# Patient Record
Sex: Male | Born: 1990 | Race: Black or African American | Hispanic: No | Marital: Married | Smoking: Current some day smoker
Health system: Southern US, Community
[De-identification: ages and names within clinical notes are randomized; demographics above are authoritative.]

---

## 2015-06-18 ENCOUNTER — Encounter (HOSPITAL_BASED_OUTPATIENT_CLINIC_OR_DEPARTMENT_OTHER): Payer: Self-pay | Admitting: *Deleted

## 2015-06-18 ENCOUNTER — Emergency Department (HOSPITAL_BASED_OUTPATIENT_CLINIC_OR_DEPARTMENT_OTHER): Payer: Self-pay

## 2015-06-18 ENCOUNTER — Emergency Department (HOSPITAL_BASED_OUTPATIENT_CLINIC_OR_DEPARTMENT_OTHER)
Admission: EM | Admit: 2015-06-18 | Discharge: 2015-06-18 | Disposition: A | Discharge status: Home / Self Care | Payer: Self-pay | Attending: Emergency Medicine | Admitting: Emergency Medicine

## 2015-06-18 DIAGNOSIS — J069 Acute upper respiratory infection, unspecified: Secondary | ICD-10-CM | POA: Insufficient documentation

## 2015-06-18 DIAGNOSIS — F17200 Nicotine dependence, unspecified, uncomplicated: Secondary | ICD-10-CM | POA: Insufficient documentation

## 2015-06-18 DIAGNOSIS — J40 Bronchitis, not specified as acute or chronic: Secondary | ICD-10-CM | POA: Insufficient documentation

## 2015-06-18 MED ORDER — DM-GUAIFENESIN ER 30-600 MG PO TB12: 1.0000 | ORAL_TABLET | Freq: Two times a day (BID) | ORAL | Status: AC

## 2015-06-18 NOTE — ED Notes (Signed)
States has had a low grade fever since Monday. Has taken Alka-Seltzer Cold and Flu liquid gels

## 2015-06-18 NOTE — ED Provider Notes (Signed)
CSN: [id number]     Arrival date & time 06/18/15  [id number] History   First MD Initiated Contact with Patient 06/18/15 1120     Chief Complaint  Patient presents with  . Fever     (Consider location/radiation/quality/duration/timing/severity/associated sxs/prior Treatment) Patient is a 24 y.o. male presenting with fever. The history is provided by the patient.  Fever Associated symptoms: cough and myalgias   Associated symptoms: no chest pain, no confusion, no congestion, no diarrhea, no dysuria, no headaches, no nausea, no rash and no vomiting    patient with one-week history of fever congestion cough bodyaches. No nausea vomiting or diarrhea no abdominal pain. No severe headache.  History reviewed. No pertinent past medical history. History reviewed. No pertinent past surgical history. No family history on file. Social History  Substance Use Topics  . Smoking status: Current Every Day Smoker  . Smokeless tobacco: None  . Alcohol Use: Yes    Review of Systems  Constitutional: Positive for fever.  HENT: Negative for congestion.   Eyes: Negative for redness.  Respiratory: Positive for cough and shortness of breath.   Cardiovascular: Negative for chest pain.  Gastrointestinal: Negative for nausea, vomiting, abdominal pain and diarrhea.  Genitourinary: Negative for dysuria.  Musculoskeletal: Positive for myalgias.  Skin: Negative for rash.  Neurological: Negative for headaches.  Hematological: Does not bruise/bleed easily.  Psychiatric/Behavioral: Negative for confusion.      Allergies  Review of patient's allergies indicates no known allergies.  Home Medications   Prior to Admission medications   Medication Sig Start Date End Date Taking? Authorizing Provider  dextromethorphan-guaiFENesin (MUCINEX DM) 30-600 MG 12hr tablet Take 1 tablet by mouth 2 (two) times daily. 06/18/15   Vanetta MuldersScott Zackarie Chason, MD   BP 104/64 mmHg  Pulse 80  Temp(Src) 98.5 F (36.9 C) (Oral)  Resp 18   Ht 5\' 6"  (1.676 m)  Wt 102.059 kg  BMI 36.33 kg/m2  SpO2 98% Physical Exam  Constitutional: He is oriented to person, place, and time. He appears well-developed and well-nourished. No distress.  HENT:  Head: Normocephalic and atraumatic.  Mouth/Throat: Oropharynx is clear and moist.  Eyes: Conjunctivae and EOM are normal. Pupils are equal, round, and reactive to light.  Neck: Normal range of motion. Neck supple.  Cardiovascular: Normal rate, regular rhythm and normal heart sounds.   No murmur heard. Pulmonary/Chest: Effort normal and breath sounds normal. No respiratory distress. He has no wheezes. He has no rales.  Abdominal: Soft. Bowel sounds are normal. There is no tenderness.  Musculoskeletal: Normal range of motion. He exhibits no edema.  Neurological: He is alert and oriented to person, place, and time. No cranial nerve deficit. He exhibits normal muscle tone. Coordination normal.  Skin: Skin is warm. No rash noted.  Nursing note and vitals reviewed.   ED Course  Procedures (including critical care time) Labs Review Labs Reviewed - No data to display  Imaging Review Dg Chest 2 View  06/18/2015  CLINICAL DATA:  Productive cough and low-grade fever. History of asthma. Current smoker. EXAM: CHEST  2 VIEW COMPARISON:  None. FINDINGS: Normal mediastinum and cardiac silhouette. Normal pulmonary vasculature. No evidence of effusion, infiltrate, or pneumothorax. No acute bony abnormality. IMPRESSION: No acute cardiopulmonary process. Electronically Signed   By: Genevive BiStewart  Edmunds M.D.   On: 06/18/2015 13:08   I have personally reviewed and evaluated these images and lab results as part of my medical decision-making.   EKG Interpretation None      MDM  Final diagnoses:  URI (upper respiratory infection)  Bronchitis    Chest x-ray negative for pneumonia. Patient with upper respiratory cough congestion, non-productive cough since Monday. Patient nontoxic no acute distress.  Patient will be treated symptomatically with Mucinex DM. Symptoms consistent with upper respiratory infection most likely viral and/or bronchitis. Patient without any wheezing.    Vanetta Mulders, MD 06/18/15 1350

## 2015-06-18 NOTE — ED Notes (Signed)
DC instructions reviewed with pt, discussed importance of hand washing, increasing PO fluids and using medication as prescribed by EDP. Opportunity for questions provided. Teach Back Method used

## 2015-06-18 NOTE — Discharge Instructions (Signed)
Chest x-ray negative for pneumonia. Use the Mucinex DM as directed will help suppress the cough and keep the phlegm loose. Can take Motrin as needed in addition for any body aches. Return for any new or worse symptoms. Work note provided.

## 2016-04-09 ENCOUNTER — Emergency Department (HOSPITAL_BASED_OUTPATIENT_CLINIC_OR_DEPARTMENT_OTHER)
Admission: EM | Admit: 2016-04-09 | Discharge: 2016-04-09 | Disposition: A | Discharge status: Home / Self Care | Payer: Self-pay | Attending: Emergency Medicine | Admitting: Emergency Medicine

## 2016-04-09 ENCOUNTER — Encounter (HOSPITAL_BASED_OUTPATIENT_CLINIC_OR_DEPARTMENT_OTHER): Payer: Self-pay

## 2016-04-09 DIAGNOSIS — M542 Cervicalgia: Secondary | ICD-10-CM | POA: Insufficient documentation

## 2016-04-09 DIAGNOSIS — F17200 Nicotine dependence, unspecified, uncomplicated: Secondary | ICD-10-CM | POA: Insufficient documentation

## 2016-04-09 MED ORDER — METHOCARBAMOL 500 MG PO TABS: 500.0000 mg | ORAL_TABLET | Freq: Two times a day (BID) | ORAL | 0 refills | Status: DC

## 2016-04-09 MED ORDER — METHOCARBAMOL 500 MG PO TABS
500.0000 mg | ORAL_TABLET | Freq: Once | ORAL | Status: AC
  Administered 2016-04-09: 500 mg via ORAL
  Filled 2016-04-09: qty 1

## 2016-04-09 MED ORDER — NAPROXEN 500 MG PO TABS: 500.0000 mg | ORAL_TABLET | Freq: Two times a day (BID) | ORAL | 0 refills | Status: DC

## 2016-04-09 MED ORDER — NAPROXEN 250 MG PO TABS
500.0000 mg | ORAL_TABLET | Freq: Once | ORAL | Status: AC
  Administered 2016-04-09: 500 mg via ORAL
  Filled 2016-04-09: qty 2

## 2016-04-09 NOTE — ED Notes (Signed)
Pt had CT of head and neck at [facility] earlier this evening

## 2016-04-09 NOTE — Discharge Instructions (Signed)
Your head and neck CT from [facility] earlier today was normal. Take the prescribed medication as directed. May wish to use heat to help relax muscles in your neck. Follow-up with  Return to the ED for new or worsening symptoms.

## 2016-04-09 NOTE — ED Provider Notes (Signed)
[facility]   CSN: [id number] Arrival date & time: 04/09/16  2004  By signing my name below, I, Linna DarnerRussell Turner, attest that this documentation has been prepared under the direction and in the presence of Sharilyn SitesLisa Nolawi Kanady, PA-C. Electronically Signed: Linna Darnerussell Turner, Scribe. 04/09/2016. 9:22 PM.  History   Chief Complaint Chief Complaint  Patient presents with  . Neck Pain    The history is provided by the patient. No language interpreter was used.     HPI Comments: Raymond Collins is a 25 y.o. male who presents to the Emergency Department complaining of sudden onset, constant, neck pain beginning two days ago. Pt reports he bumped the top of his head while getting into an SUV and his neck pain presented after; he notes he did not lose consciousness. He states his pain is most significant on the right side of his neck. Pt notes associated neck stiffness. He reports pain radiation down his spine with twisting his neck. He notes he sleeps on his right side. Pt went to [facility] earlier today and had a CT of his head/neck taken that was negative. He has tried tylenol and ibuprofen with no relief of his symptoms. He denies head pain, numbness, weakness, or any other associated symptoms.  History reviewed. No pertinent past medical history.  There are no active problems to display for this patient.   History reviewed. No pertinent surgical history.     Home Medications    Prior to Admission medications   Medication Sig Start Date End Date Taking? Authorizing Provider  dextromethorphan-guaiFENesin (MUCINEX DM) 30-600 MG 12hr tablet Take 1 tablet by mouth 2 (two) times daily. 06/18/15   Vanetta MuldersScott Zackowski, MD    Family History No family history on file.  Social History Social History  Substance Use Topics  . Smoking status: Current Every Day Smoker  . Smokeless tobacco: Not on file  . Alcohol use Yes     Allergies   Review of patient's allergies  indicates no known allergies.   Review of Systems Review of Systems  Musculoskeletal: Positive for neck pain and neck stiffness.  Neurological: Negative for syncope, weakness and numbness.  All other systems reviewed and are negative.   Physical Exam Updated Vital Signs BP 113/71 (BP Location: Left Arm)   Pulse 86   Temp 98.1 F (36.7 C) (Oral)   Resp 18   Ht 5\' 6"  (1.676 m)   Wt 225 lb (102.1 kg)   SpO2 100%   BMI 36.32 kg/m   Physical Exam  Constitutional: He is oriented to person, place, and time. He appears well-developed and well-nourished. No distress.  HENT:  Head: Normocephalic and atraumatic.  Right Ear: External ear normal.  Left Ear: External ear normal.  Mouth/Throat: Oropharynx is clear and moist.  Eyes: Conjunctivae and EOM are normal. Pupils are equal, round, and reactive to light.  Neck: Normal range of motion and full passive range of motion without pain. Neck supple. No neck rigidity.  No rigidity, no meningismus  Cardiovascular: Normal rate, regular rhythm and normal heart sounds.   No murmur heard. Pulmonary/Chest: Effort normal and breath sounds normal. No respiratory distress. He has no wheezes. He has no rhonchi.  Abdominal: Soft. Bowel sounds are normal. There is no tenderness. There is no guarding.  Musculoskeletal: Normal range of motion. He exhibits no edema.       Cervical back: He exhibits pain and spasm.  Tenderness and spasm of the cervical paraspinal muscles bilaterally,  full range of motion maintained, no rigidity, no midline tenderness or step-off  Neurological: He is alert and oriented to person, place, and time. He has normal strength. He displays no tremor. No cranial nerve deficit or sensory deficit. He displays no seizure activity.  AAOx3, answering questions and following commands appropriately; equal strength UE and LE bilaterally; CN grossly intact; moves all extremities appropriately without ataxia; no focal neuro deficits or facial  asymmetry appreciated  Skin: Skin is warm and dry. No rash noted. He is not diaphoretic.  Psychiatric: He has a normal mood and affect. His behavior is normal. Thought content normal.  Nursing note and vitals reviewed.   ED Treatments / Results  Labs (all labs ordered are listed, but only abnormal results are displayed) Labs Reviewed - No data to display  EKG  EKG Interpretation None       Radiology No results found.  Procedures Procedures (including critical care time)  DIAGNOSTIC STUDIES: Oxygen Saturation is 100% on RA, normal by my interpretation.    COORDINATION OF CARE: 9:27 PM Discussed treatment plan with pt at bedside and pt agreed to plan.  Medications Ordered in ED Medications - No data to display   Initial Impression / Assessment and Plan / ED Course  I have reviewed the triage vital signs and the nursing notes.  Pertinent labs & imaging results that were available during my care of the patient were reviewed by me and considered in my medical decision making (see chart for details).  Clinical Course   25 year old male here with neck pain. He struck the top of his head will getting into an SUV 2 days ago. He was at [facility] earlier today and had a head and neck CT which I have reviewed-- no acute findings, specifically no intracranial hemorrhage or cervical spine fracture. On exam here patient with tenderness and spasm of the cervical paraspinal muscles. This is likely the source of his pain. He has no neurologic deficits to suggest central cord syndrome. Will treat symptomatically. Recommended PCP follow-up.  Discussed plan with patient, he acknowledged understanding and agreed with plan of care.  Return precautions given for new or worsening symptoms.  I personally performed the services described in this documentation, which was scribed in my presence. The recorded information has been reviewed and is accurate.  Final Clinical Impressions(s) / ED  Diagnoses   Final diagnoses:  Neck pain    New Prescriptions Discharge Medication List as of 04/09/2016  9:30 PM    START taking these medications   Details  methocarbamol (ROBAXIN) 500 MG tablet Take 1 tablet (500 mg total) by mouth 2 (two) times daily., Starting Mon 04/09/2016, Print    naproxen (NAPROSYN) 500 MG tablet Take 1 tablet (500 mg total) by mouth 2 (two) times daily with a meal., Starting Mon 04/09/2016, Print         Garlon Hatchet, PA-C 04/09/16 2210    Lavera Guise, MD 04/10/16 2100

## 2016-04-09 NOTE — ED Notes (Signed)
PA at bedside.

## 2016-04-09 NOTE — ED Triage Notes (Signed)
Pt c/o neck and head pain after hitting his head getting into the back of an SUV on Saturday, no LOC, pt tried tylenol last night and it did not help.  Pt seems able to move his head around without difficulty.

## 2016-05-24 ENCOUNTER — Encounter (HOSPITAL_BASED_OUTPATIENT_CLINIC_OR_DEPARTMENT_OTHER): Payer: Self-pay | Admitting: *Deleted

## 2016-05-24 ENCOUNTER — Emergency Department (HOSPITAL_BASED_OUTPATIENT_CLINIC_OR_DEPARTMENT_OTHER)
Admission: EM | Admit: 2016-05-24 | Discharge: 2016-05-24 | Disposition: A | Discharge status: Home / Self Care | Payer: Self-pay | Attending: Physician Assistant | Admitting: Physician Assistant

## 2016-05-24 DIAGNOSIS — F17200 Nicotine dependence, unspecified, uncomplicated: Secondary | ICD-10-CM | POA: Insufficient documentation

## 2016-05-24 DIAGNOSIS — J069 Acute upper respiratory infection, unspecified: Secondary | ICD-10-CM | POA: Insufficient documentation

## 2016-05-24 MED ORDER — PSEUDOEPHEDRINE HCL 30 MG PO TABS: 30.0000 mg | ORAL_TABLET | Freq: Four times a day (QID) | ORAL | 0 refills | Status: AC | PRN

## 2016-05-24 MED ORDER — GUAIFENESIN 100 MG/5ML PO LIQD: 100.0000 mg | ORAL | 0 refills | Status: AC | PRN

## 2016-05-24 MED ORDER — SALINE SPRAY 0.65 % NA SOLN: 1.0000 | NASAL | 0 refills | Status: AC | PRN

## 2016-05-24 MED FILL — ROBAFEN 100 MG/5 ML SYRUP: 100 | 2 days supply | Qty: 118 | Fill #0

## 2016-05-24 MED FILL — DEEP SEA 0.65% NOSE SPRAY: 0.65 | 30 days supply | Qty: 44 | Fill #0

## 2016-05-24 MED FILL — SUDOGEST 30 MG TABLET: 30 | 8 days supply | Qty: 30 | Fill #0

## 2016-05-24 NOTE — ED Triage Notes (Signed)
Pt c/o URI symptoms x 2 days 

## 2016-05-24 NOTE — ED Provider Notes (Signed)
[facility]   CSN: [id number] Arrival date & time: 05/24/16  [id number]     History   Chief Complaint Chief Complaint  Patient presents with  . URI    HPI Raymond Collins is a 25 y.o. male.  Patient presents to the emergency department with chief complaint of cough, congestion, runny nose, sore throat. He states that the symptoms started yesterday. He also states when he sneezes, he sees some blood-tinged mucus. He has not tried taking anything for his symptoms. He denies any fevers chills. 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 There are no other associated symptoms. There are no other modifying factors.   The history is provided by the patient. No language interpreter was used.    History reviewed. No pertinent past medical history.  There are no active problems to display for this patient.   History reviewed. No pertinent surgical history.     Home Medications    Prior to Admission medications   Medication Sig Start Date End Date Taking? Authorizing Provider  ranitidine (ZANTAC) 150 MG tablet Take 150 mg by mouth 2 (two) times daily.   Yes Historical Provider, MD  dextromethorphan-guaiFENesin (MUCINEX DM) 30-600 MG 12hr tablet Take 1 tablet by mouth 2 (two) times daily. 06/18/15   Vanetta MuldersScott Zackowski, MD  naproxen (NAPROSYN) 500 MG tablet Take 1 tablet (500 mg total) by mouth 2 (two) times daily with a meal. 04/09/16   Garlon HatchetLisa M Sanders, PA-C    Family History History reviewed. No pertinent family history.  Social History Social History  Substance Use Topics  . Smoking status: Current Some Day Smoker  . Smokeless tobacco: Not on file  . Alcohol use Yes     Allergies   Patient has no known allergies.   Review of Systems Review of Systems  Constitutional: Negative for chills and fever.  HENT: Positive for postnasal drip, rhinorrhea, sinus pressure, sneezing and sore throat.     Respiratory: Positive for cough. Negative for shortness of breath.   Cardiovascular: Negative for chest pain.  Gastrointestinal: Negative for abdominal pain, constipation, diarrhea, nausea and vomiting.  Genitourinary: Negative for dysuria.     Physical Exam Updated Vital Signs BP 121/71 (BP Location: Right Arm)   Pulse 86   Temp 98.7 F (37.1 C)   Resp 18   Ht 5\' 6"  (1.676 m)   Wt 102.1 kg   SpO2 99%   BMI 36.32 kg/m   Physical Exam Physical Exam  Constitutional: Pt  is oriented to person, place, and time. Appears well-developed and well-nourished. No distress.  HENT:  Head: Normocephalic and atraumatic.  Right Ear: Tympanic membrane, external ear and ear canal normal.  Left Ear: Tympanic membrane, external ear and ear canal normal.  Nose: Mucosal edema and moderate rhinorrhea present. No epistaxis. Right sinus exhibits no maxillary sinus tenderness and no frontal sinus tenderness. Left sinus exhibits no maxillary sinus tenderness and no frontal sinus tenderness.  Mouth/Throat: Uvula is midline and mucous membranes are normal. Mucous membranes are not pale and not cyanotic. No oropharyngeal exudate, posterior oropharyngeal edema, posterior oropharyngeal erythema or tonsillar abscesses.  Eyes: Conjunctivae are normal. Pupils are equal, round, and reactive to light.  Neck: Normal range of motion and full passive range of motion without pain.  Cardiovascular: Normal rate and intact distal pulses.   Pulmonary/Chest: Effort normal and breath sounds normal. No stridor.  Clear and equal breath sounds without  focal wheezes, rhonchi, rales  Abdominal: Soft. Bowel sounds are normal. There is no tenderness.  Musculoskeletal: Normal range of motion.  Lymphadenopathy:    Pthas no cervical adenopathy.  Neurological: Pt is alert and oriented to person, place, and time.  Skin: Skin is warm and dry. No rash noted. Pt is not diaphoretic.  Psychiatric: Normal mood and affect.  Nursing note  and vitals reviewed.    ED Treatments / Results  Labs (all labs ordered are listed, but only abnormal results are displayed) Labs Reviewed - No data to display  EKG  EKG Interpretation None       Radiology No results found.  Procedures Procedures (including critical care time)  Medications Ordered in ED Medications - No data to display   Initial Impression / Assessment and Plan / ED Course  I have reviewed the triage vital signs and the nursing notes.  Pertinent labs & imaging results that were available during my care of the patient were reviewed by me and considered in my medical decision making (see chart for details).  Clinical Course     Patients symptoms are consistent with URI, likely viral etiology. Discussed that antibiotics are not indicated for viral infections. Pt will be discharged with symptomatic treatment.  Verbalizes understanding and is agreeable with plan. Pt is hemodynamically stable & in NAD prior to dc.   Final Clinical Impressions(s) / ED Diagnoses   Final diagnoses:  Upper respiratory tract infection, unspecified type    New Prescriptions New Prescriptions   GUAIFENESIN (ROBITUSSIN) 100 MG/5ML LIQUID    Take 5-10 mLs (100-200 mg total) by mouth every 4 (four) hours as needed for cough.   PSEUDOEPHEDRINE (SUDAFED) 30 MG TABLET    Take 1 tablet (30 mg total) by mouth every 6 (six) hours as needed for congestion.   SODIUM CHLORIDE (OCEAN) 0.65 % SOLN NASAL SPRAY    Place 1 spray into both nostrils as needed for congestion.     Roxy Horsemanobert Deandra Gadson, PA-C 05/24/16 [id number]    Courteney Randall AnLyn Mackuen, MD 05/24/16 1354

## 2017-02-13 ENCOUNTER — Emergency Department (HOSPITAL_BASED_OUTPATIENT_CLINIC_OR_DEPARTMENT_OTHER): Payer: Self-pay

## 2017-02-13 ENCOUNTER — Emergency Department (HOSPITAL_BASED_OUTPATIENT_CLINIC_OR_DEPARTMENT_OTHER)
Admission: EM | Admit: 2017-02-13 | Discharge: 2017-02-14 | Disposition: A | Discharge status: Home / Self Care | Payer: Self-pay | Attending: Emergency Medicine | Admitting: Emergency Medicine

## 2017-02-13 ENCOUNTER — Encounter (HOSPITAL_BASED_OUTPATIENT_CLINIC_OR_DEPARTMENT_OTHER): Payer: Self-pay

## 2017-02-13 DIAGNOSIS — F17200 Nicotine dependence, unspecified, uncomplicated: Secondary | ICD-10-CM | POA: Insufficient documentation

## 2017-02-13 DIAGNOSIS — R0789 Other chest pain: Secondary | ICD-10-CM | POA: Insufficient documentation

## 2017-02-13 NOTE — ED Triage Notes (Signed)
Pt complaining of mid sternal chest pain that radiates to his back x 2 days. Pt reports having SOB earlier today at work. Pt denies SOB on assessment. Pt speaking in clear complete sentences and has equal unlabored respirations.

## 2017-02-14 LAB — CBC WITH DIFFERENTIAL/PLATELET
BASOS PCT: 1 %
Basophils Absolute: 0 10*3/uL (ref 0.0–0.1)
EOS ABS: 0.3 10*3/uL (ref 0.0–0.7)
EOS PCT: 4 %
HCT: 43.3 % (ref 39.0–52.0)
Hemoglobin: 14.4 g/dL (ref 13.0–17.0)
Lymphocytes Relative: 33 %
Lymphs Abs: 2.5 10*3/uL (ref 0.7–4.0)
MCH: 23.2 pg — AB (ref 26.0–34.0)
MCHC: 33.3 g/dL (ref 30.0–36.0)
MCV: 69.6 fL — AB (ref 78.0–100.0)
MONO ABS: 0.5 10*3/uL (ref 0.1–1.0)
Monocytes Relative: 7 %
Neutro Abs: 4.1 10*3/uL (ref 1.7–7.7)
Neutrophils Relative %: 55 %
PLATELETS: 215 10*3/uL (ref 150–400)
RBC: 6.22 MIL/uL — ABNORMAL HIGH (ref 4.22–5.81)
RDW: 17.2 % — ABNORMAL HIGH (ref 11.5–15.5)
WBC: 7.4 10*3/uL (ref 4.0–10.5)

## 2017-02-14 LAB — BASIC METABOLIC PANEL
Anion gap: 7 (ref 5–15)
BUN: 18 mg/dL (ref 6–20)
CALCIUM: 9.1 mg/dL (ref 8.9–10.3)
CO2: 27 mmol/L (ref 22–32)
CREATININE: 1.2 mg/dL (ref 0.61–1.24)
Chloride: 104 mmol/L (ref 101–111)
GFR calc Af Amer: >60 mL/min (ref >60)
GLUCOSE: 104 mg/dL — AB (ref 65–99)
Potassium: 3.7 mmol/L (ref 3.5–5.1)
Sodium: 138 mmol/L (ref 135–145)

## 2017-02-14 LAB — TROPONIN I: Troponin I: <0.03 ng/mL (ref <0.03)

## 2017-02-14 MED ORDER — SUCRALFATE 1 GM/10ML PO SUSP
1.0000 g | Freq: Three times a day (TID) | ORAL | Status: DC
  Administered 2017-02-14: 1 g via ORAL
  Filled 2017-02-14: qty 10

## 2017-02-14 MED ORDER — PANTOPRAZOLE SODIUM 40 MG PO TBEC
40.0000 mg | DELAYED_RELEASE_TABLET | Freq: Once | ORAL | Status: AC
  Administered 2017-02-14: 40 mg via ORAL
  Filled 2017-02-14: qty 1

## 2017-02-14 MED ORDER — OMEPRAZOLE 20 MG PO CPDR: 20.0000 mg | DELAYED_RELEASE_CAPSULE | Freq: Every day | ORAL | 0 refills | Status: AC

## 2017-02-14 NOTE — ED Provider Notes (Signed)
[facility] Provider Note: Lowella Dell, MD, FACEP  CSN: [id number] MRN: [id number] ARRIVAL: 02/13/17 at 2036 ROOM: MH03/MH03   CHIEF COMPLAINT  Chest Pain   HISTORY OF PRESENT ILLNESS  02/14/17 12:01 AM Raymond Collins is a 26 y.o. male who complains of an intermittent lower substernal chest pain that awakened him from sleep about 4 AM 2 mornings ago. He describes the pain as sharp, well localized and mild, although he rated an 8 out of 10 in triage. He states the pain is intermittent and lasts about 3 minutes at a time. He seems to have more episodes when active and fewer episodes when resting. He is equivocal about associated shortness of breath but there is no associated diaphoresis, nausea or vomiting. He has been coughing but does not relate the pain to the cough.   History reviewed. No pertinent past medical history.  History reviewed. No pertinent surgical history.  No family history on file.  Social History  Substance Use Topics  . Smoking status: Current Some Day Smoker  . Smokeless tobacco: Never Used  . Alcohol use Yes    Prior to Admission medications   Medication Sig Start Date End Date Taking? Authorizing Provider  dextromethorphan-guaiFENesin (MUCINEX DM) 30-600 MG 12hr tablet Take 1 tablet by mouth 2 (two) times daily. 06/18/15   Vanetta Mulders, MD  guaiFENesin (ROBITUSSIN) 100 MG/5ML liquid Take 5-10 mLs (100-200 mg total) by mouth every 4 (four) hours as needed for cough. 05/24/16   Roxy Horseman, PA-C  naproxen (NAPROSYN) 500 MG tablet Take 1 tablet (500 mg total) by mouth 2 (two) times daily with a meal. 04/09/16   Garlon Hatchet, PA-C  pseudoephedrine (SUDAFED) 30 MG tablet Take 1 tablet (30 mg total) by mouth every 6 (six) hours as needed for congestion. 05/24/16   Roxy Horseman, PA-C  ranitidine (ZANTAC) 150 MG tablet Take 150 mg by mouth 2 (two) times daily.    [provider]  sodium chloride (OCEAN) 0.65 % SOLN nasal spray  Place 1 spray into both nostrils as needed for congestion. 05/24/16   Roxy Horseman, PA-C    Allergies Patient has no known allergies.   REVIEW OF SYSTEMS  Negative except as noted here or in the History of Present Illness.   PHYSICAL EXAMINATION  Initial Vital Signs Blood pressure 124/72, pulse 75, temperature 98.1 F (36.7 C), resp. rate 18, height 5\' 6"  (1.676 m), weight 102.1 kg (225 lb), SpO2 99 %.  Examination General: Well-developed, well-nourished male in no acute distress; appearance consistent with age of record HENT: normocephalic; atraumatic Eyes: pupils equal, round and reactive to light; extraocular muscles intact Neck: supple Heart: regular rate and rhythm Lungs: clear to auscultation bilaterally; occasional cough Chest: Nontender Abdomen: soft; nondistended; nontender; bowel sounds present Extremities: No deformity; full range of motion; pulses normal Neurologic: Awake, alert and oriented; motor function intact in all extremities and symmetric; no facial droop Skin: Warm and dry Psychiatric: Normal mood and affect   RESULTS  Summary of this visit's results, reviewed by myself:   EKG Interpretation  Date/Time:  Wednesday February 13 2017 20:45:48 EDT Ventricular Rate:  89 PR Interval:  134 QRS Duration: 98 QT Interval:  364 QTC Calculation: 442 R Axis:   61 Text Interpretation:  Normal sinus rhythm Nonspecific T wave abnormality Abnormal ECG No previous ECGs available Confirmed by Paula Libra ([id number]) on 02/14/2017 12:02:01 AM      Laboratory Studies: Results for orders placed or performed during the hospital  encounter of 02/13/17 (from the past 24 hour(s))  Troponin I     Status: None   Collection Time: 02/14/17 12:13 AM  Result Value Ref Range   Troponin I <0.03 <0.03 ng/mL  Basic metabolic panel     Status: Abnormal   Collection Time: 02/14/17 12:13 AM  Result Value Ref Range   Sodium 138 135 - 145 mmol/L   Potassium 3.7 3.5 - 5.1 mmol/L    Chloride 104 101 - 111 mmol/L   CO2 27 22 - 32 mmol/L   Glucose, Bld 104 (H) 65 - 99 mg/dL   BUN 18 6 - 20 mg/dL   Creatinine, Ser 1.61 0.61 - 1.24 mg/dL   Calcium 9.1 8.9 - 09.6 mg/dL   GFR calc non Af Amer >60 >60 mL/min   GFR calc Af Amer >60 >60 mL/min   Anion gap 7 5 - 15  CBC with Differential/Platelet     Status: Abnormal   Collection Time: 02/14/17 12:13 AM  Result Value Ref Range   WBC 7.4 4.0 - 10.5 K/uL   RBC 6.22 (H) 4.22 - 5.81 MIL/uL   Hemoglobin 14.4 13.0 - 17.0 g/dL   HCT 04.5 40.9 - 81.1 %   MCV 69.6 (L) 78.0 - 100.0 fL   MCH 23.2 (L) 26.0 - 34.0 pg   MCHC 33.3 30.0 - 36.0 g/dL   RDW 91.4 (H) 78.2 - 95.6 %   Platelets 215 150 - 400 K/uL   Neutrophils Relative % 55 %   Neutro Abs 4.1 1.7 - 7.7 K/uL   Lymphocytes Relative 33 %   Lymphs Abs 2.5 0.7 - 4.0 K/uL   Monocytes Relative 7 %   Monocytes Absolute 0.5 0.1 - 1.0 K/uL   Eosinophils Relative 4 %   Eosinophils Absolute 0.3 0.0 - 0.7 K/uL   Basophils Relative 1 %   Basophils Absolute 0.0 0.0 - 0.1 K/uL   WBC Morphology WHITE COUNT CONFIRMED ON SMEAR    RBC Morphology TEARDROP CELLS    Smear Review PLATELET COUNT CONFIRMED BY SMEAR    Imaging Studies: Dg Chest 2 View  Result Date: 02/13/2017 CLINICAL DATA:  Chest pain and shortness of breath for 2 days. EXAM: CHEST  2 VIEW COMPARISON:  Radiographs 02/10/2016. FINDINGS: The cardiomediastinal contours are normal. The lungs are clear. Pulmonary vasculature is normal. No consolidation, pleural effusion, or pneumothorax. No acute osseous abnormalities are seen. IMPRESSION: No acute pulmonary process. Electronically Signed   By: Rubye Oaks M.D.   On: 02/13/2017 21:03    ED COURSE  Nursing notes and initial vitals signs, including pulse oximetry, reviewed.  Vitals:   02/13/17 2041 02/13/17 2042  BP: 124/72   Pulse: 75   Resp: 18   Temp: 98.1 F (36.7 C)   SpO2: 99%   Weight:  102.1 kg (225 lb)  Height:  5\' 6"  (1.676 m)   1:30 AM Chest pain is  atypical for cardiac etiology. He had equivocal improvement with Carafate. We will place him on a PPI in the meantime. If symptoms persist will refer to cardiology if symptoms acutely change or worsen he should return to ED.  PROCEDURES    ED DIAGNOSES     ICD-10-CM   1. Atypical chest pain R07.89        Paula Libra, MD 02/14/17 [phone]

## 2017-04-28 IMAGING — DX DG CHEST 2V
2 series · 2 of 2 positions shown · non-contrast
Comparison: None.

CLINICAL DATA: Productive cough and low-grade fever. History of
asthma. Current smoker.

EXAM:
CHEST  2 VIEW

[chest pa]
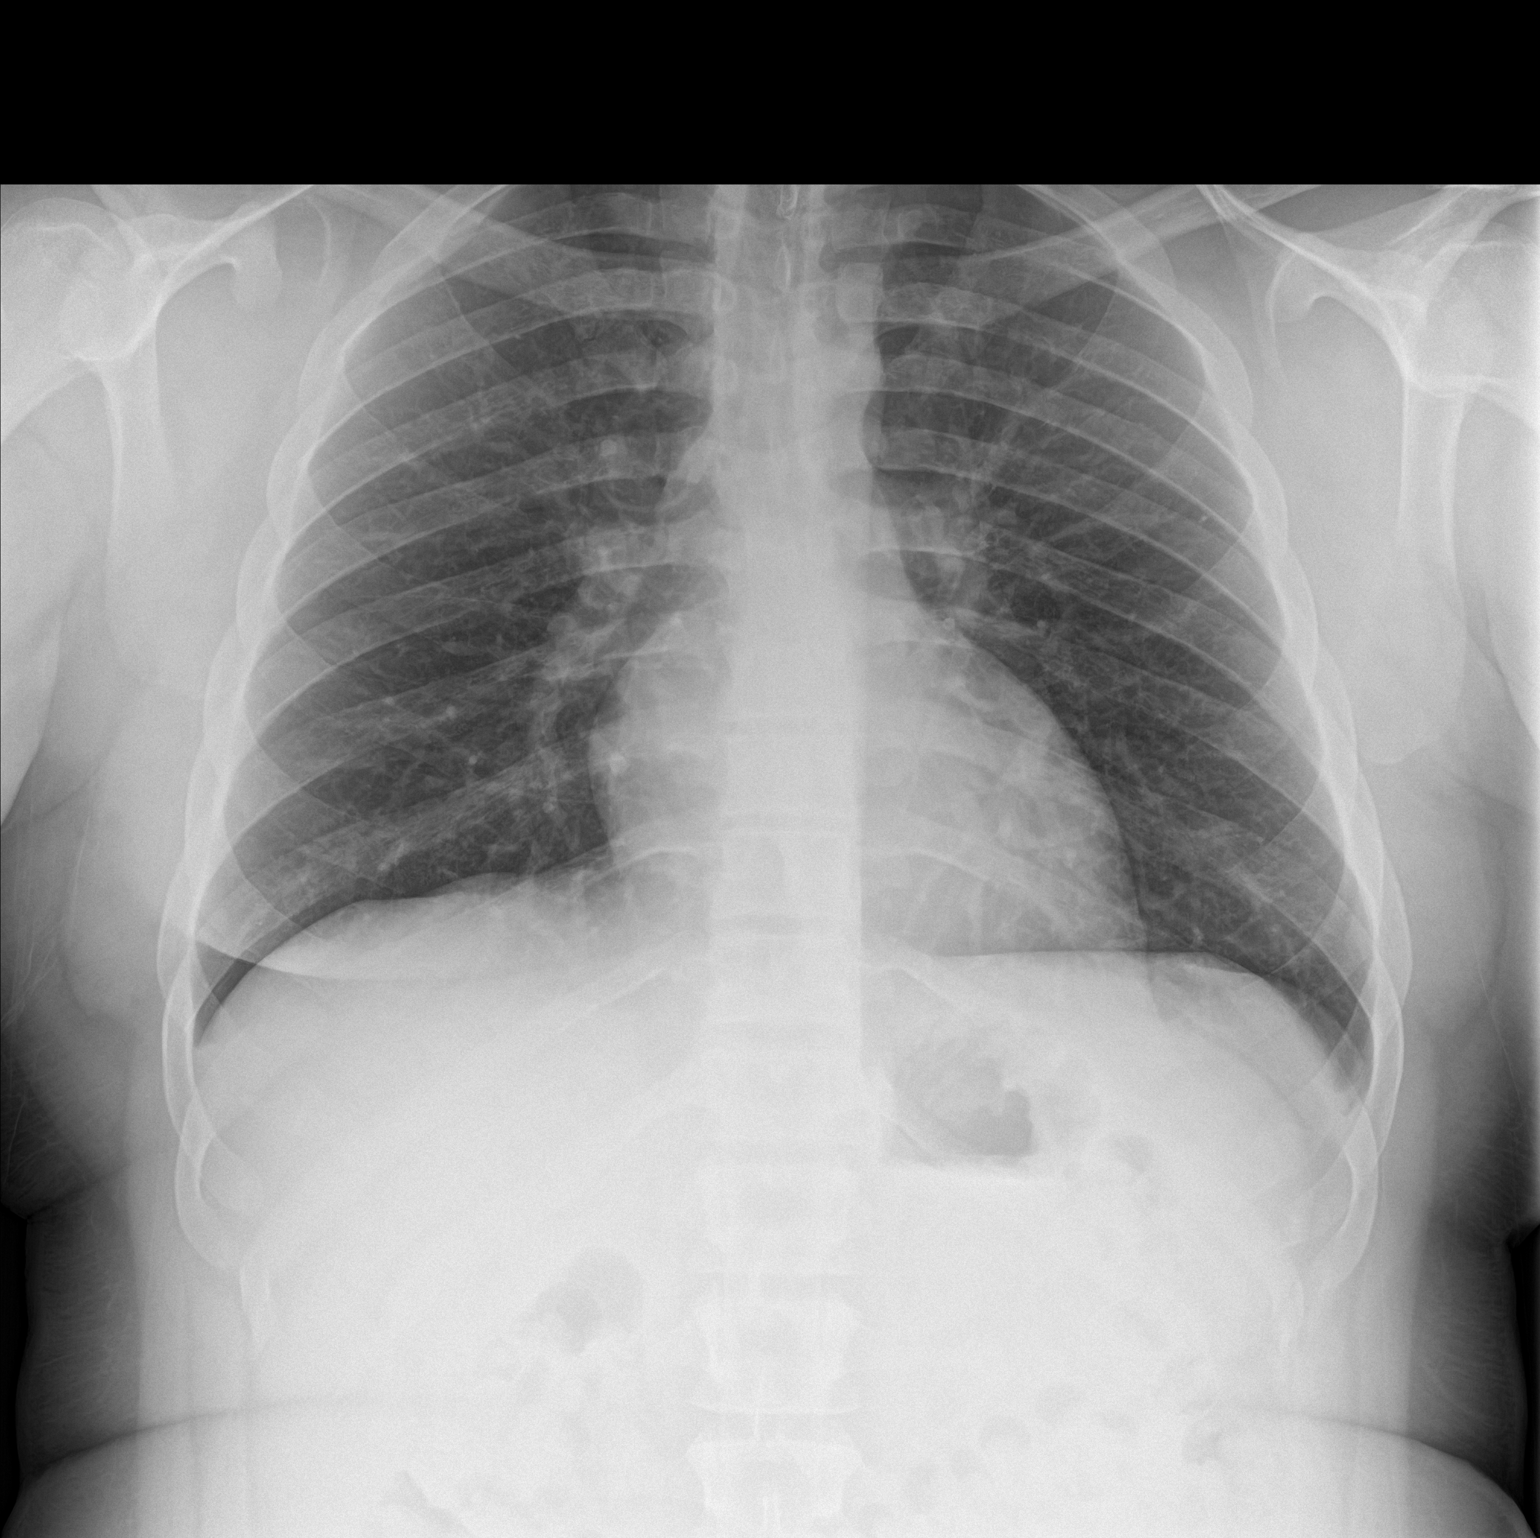

[chest lat]
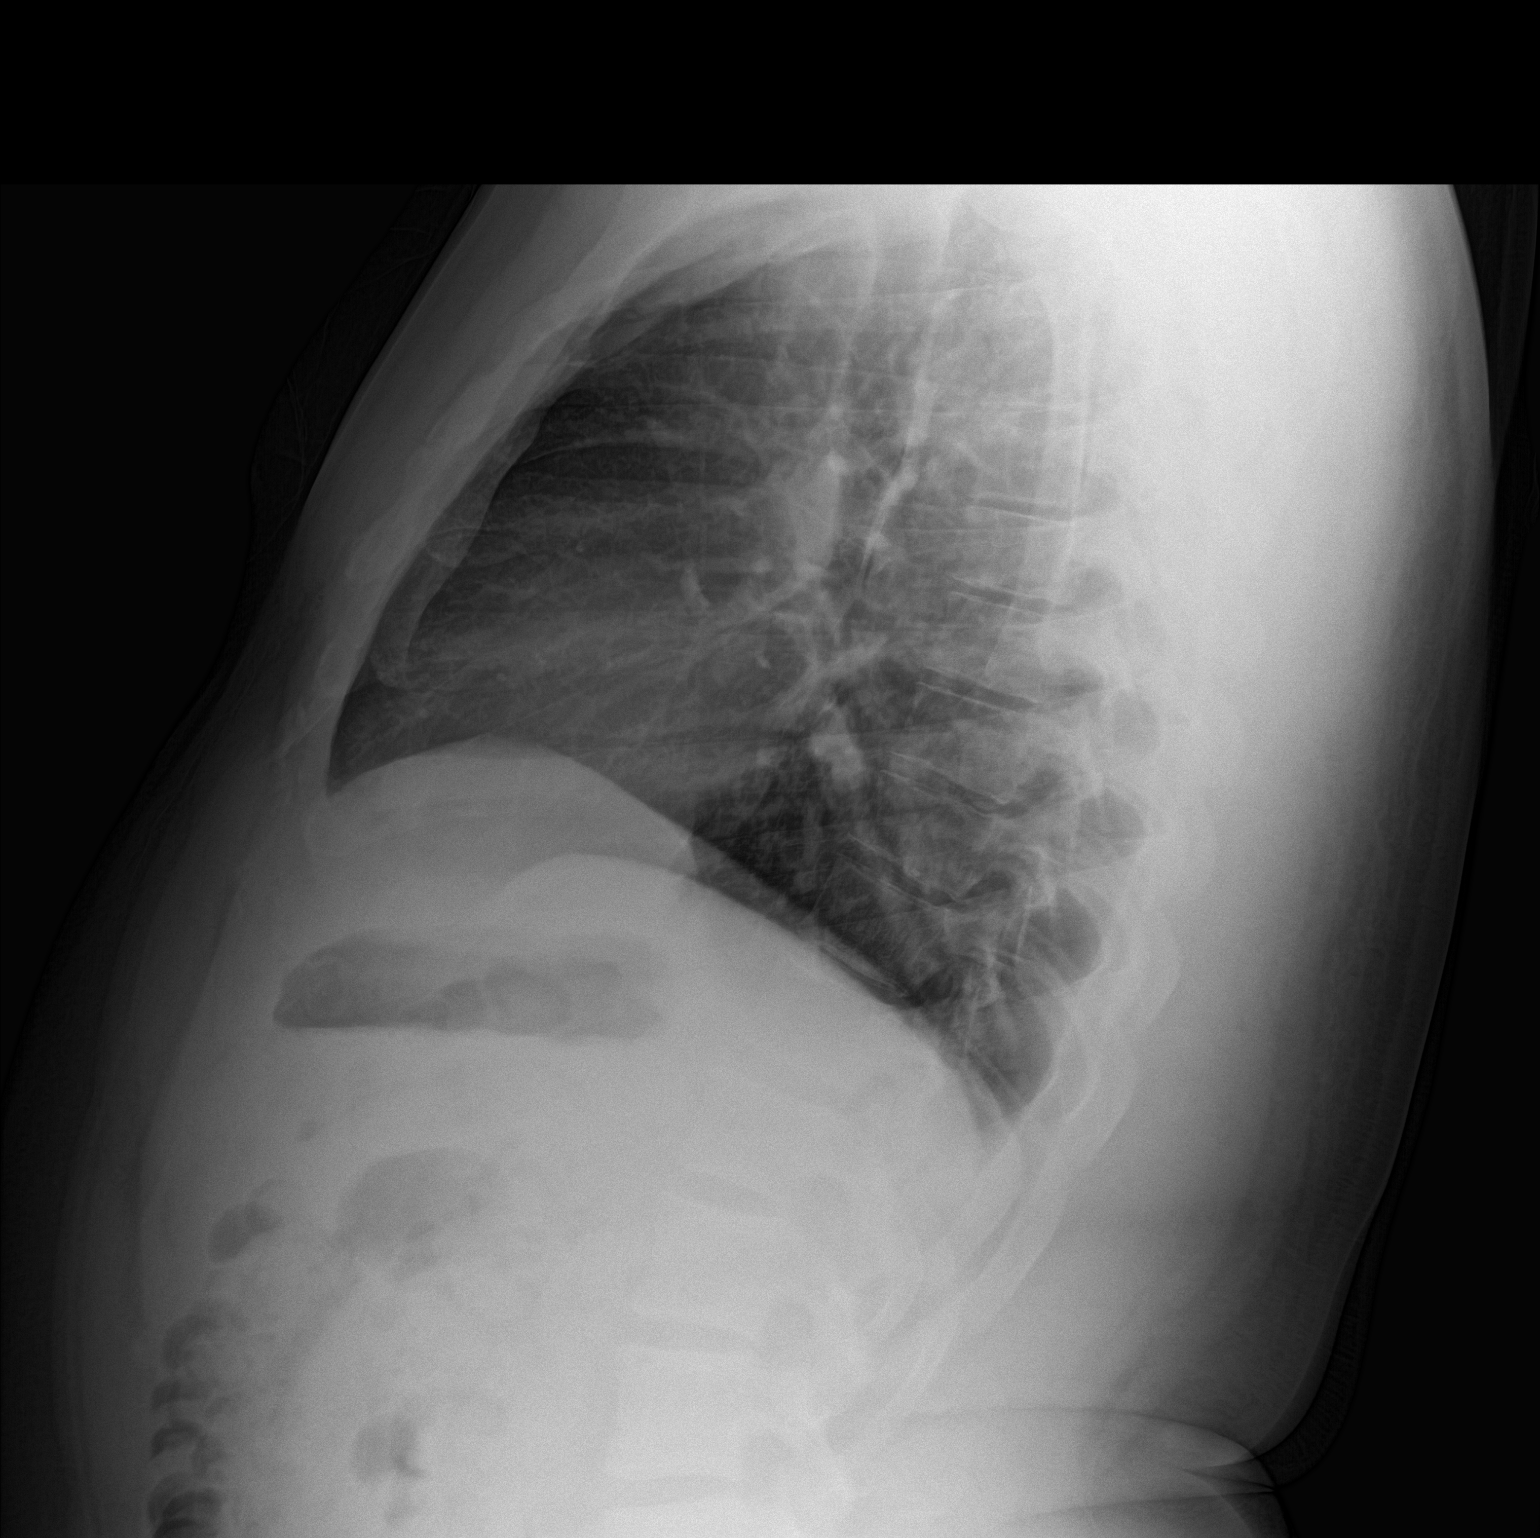

[2 of 2 positions shown; findings below may reference images not displayed]

FINDINGS: Normal mediastinum and cardiac silhouette. Normal pulmonary
vasculature. No evidence of effusion, infiltrate, or pneumothorax.
No acute bony abnormality.
IMPRESSION: No acute cardiopulmonary process.
